# Patient Record
Sex: Male | Born: 2000 | Race: Black or African American | Hispanic: No | Marital: Single | Smoking: Current every day smoker
Health system: Southern US, Community
[De-identification: ages and names within clinical notes are randomized; demographics above are authoritative.]

---
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
## 2020-02-03 ENCOUNTER — Emergency Department: Payer: Medicaid Other

## 2020-02-03 ENCOUNTER — Other Ambulatory Visit: Payer: Self-pay

## 2020-02-03 ENCOUNTER — Emergency Department
Admission: EM | Admit: 2020-02-03 | Discharge: 2020-02-04 | Disposition: A | Discharge status: Home / Self Care | Payer: Medicaid Other | Attending: Emergency Medicine | Admitting: Emergency Medicine

## 2020-02-03 DIAGNOSIS — F1310 Sedative, hypnotic or anxiolytic abuse, uncomplicated: Secondary | ICD-10-CM

## 2020-02-03 DIAGNOSIS — F17210 Nicotine dependence, cigarettes, uncomplicated: Secondary | ICD-10-CM | POA: Diagnosis not present

## 2020-02-03 DIAGNOSIS — F1910 Other psychoactive substance abuse, uncomplicated: Secondary | ICD-10-CM | POA: Diagnosis present

## 2020-02-03 DIAGNOSIS — T50901A Poisoning by unspecified drugs, medicaments and biological substances, accidental (unintentional), initial encounter: Secondary | ICD-10-CM | POA: Diagnosis present

## 2020-02-03 LAB — COMPREHENSIVE METABOLIC PANEL
ALT: 10 U/L (ref 0–44)
AST: 17 U/L (ref 15–41)
Albumin: 4.4 g/dL (ref 3.5–5.0)
Alkaline Phosphatase: 85 U/L (ref 38–126)
Anion gap: 9 (ref 5–15)
BUN: 16 mg/dL (ref 6–20)
CO2: 27 mmol/L (ref 22–32)
Calcium: 9.1 mg/dL (ref 8.9–10.3)
Chloride: 103 mmol/L (ref 98–111)
Creatinine, Ser: 1.07 mg/dL (ref 0.61–1.24)
GFR calc Af Amer: >60 mL/min (ref >60)
GFR calc non Af Amer: >60 mL/min (ref >60)
Glucose, Bld: 91 mg/dL (ref 70–99)
Potassium: 3.8 mmol/L (ref 3.5–5.1)
Sodium: 139 mmol/L (ref 135–145)
Total Bilirubin: 0.8 mg/dL (ref 0.3–1.2)
Total Protein: 7.5 g/dL (ref 6.5–8.1)

## 2020-02-03 LAB — CBC
HCT: 43.1 % (ref 39.0–52.0)
Hemoglobin: 14.7 g/dL (ref 13.0–17.0)
MCH: 29.8 pg (ref 26.0–34.0)
MCHC: 34.1 g/dL (ref 30.0–36.0)
MCV: 87.4 fL (ref 80.0–100.0)
Platelets: 323 10*3/uL (ref 150–400)
RBC: 4.93 MIL/uL (ref 4.22–5.81)
RDW: 12.8 % (ref 11.5–15.5)
WBC: 7.5 10*3/uL (ref 4.0–10.5)
nRBC: 0 % (ref 0.0–0.2)

## 2020-02-03 LAB — ETHANOL: Alcohol, Ethyl (B): <10 mg/dL (ref <10)

## 2020-02-03 LAB — SALICYLATE LEVEL: Salicylate Lvl: <7 mg/dL — ABNORMAL LOW (ref 7.0–30.0)

## 2020-02-03 LAB — ACETAMINOPHEN LEVEL: Acetaminophen (Tylenol), Serum: <10 ug/mL — ABNORMAL LOW (ref 10–30)

## 2020-02-03 NOTE — ED Notes (Signed)
This RN given ok by Dr. Darnelle Catalan and Charge RN for pt's father to come back

## 2020-02-03 NOTE — ED Notes (Signed)
This RN updated pt's dad at bedside and pt's mother via phone.

## 2020-02-03 NOTE — ED Provider Notes (Addendum)
[facility] Emergency Department Provider Note   ____________________________________________   First MD Initiated Contact with Patient 02/03/20 1841     (approximate)  I have reviewed the triage vital signs and the nursing notes.   HISTORY  Chief Complaint Drug Overdose  History limited by altered mental status  HPI Francisco Miranda is a 19 y.o. male comes from home by EMS.  He had apparently been out for with a friend's parents noted he was acting funny.  EMS said he took an unknown amount of Xanax.  Patient also fell on the entertainment center.  He denied pain to EMS.  His right foot is turned outward.  Here patient is very somnolent I am able to wake him up a little bit and he denies any pain.  I do not feel any swelling on palpating his leg.         History reviewed. No pertinent past medical history.  There are no problems to display for this patient.   History reviewed. No pertinent surgical history.  Prior to Admission medications   Not on File    Allergies Patient has no known allergies.  History reviewed. No pertinent family history.  Social History Social History   Tobacco Use  . Smoking status: Current Every Day Smoker  . Smokeless tobacco: Never Used  Substance Use Topics  . Alcohol use: Yes  . Drug use: Not on file    Review of Systems  Unable to obtain ____________________________________________   PHYSICAL EXAM:  VITAL SIGNS: ED Triage Vitals  Enc Vitals Group     BP 02/03/20 1838 118/66     Pulse Rate 02/03/20 1836 68     Resp 02/03/20 1836 18     Temp 02/03/20 1838 (!) 97.5 F (36.4 C)     Temp Source 02/03/20 1838 Oral     SpO2 02/03/20 1836 100 %     Weight 02/03/20 1835 180 lb 12.4 oz (82 kg)     Height 02/03/20 1835 5\' 6"  (1.676 m)     Head Circumference --      Peak Flow --      Pain Score 02/03/20 1834 0     Pain Loc --      Pain Edu? --      Excl. in GC? --     Constitutional: Patient  is very sleepy and difficult to arouse Eyes: Conjunctivae are normal.  Pupils are dilated gaze is disconjugate Head: Atraumatic. Nose: No congestion/rhinnorhea. Mouth/Throat: Mucous membranes are moist.  Oropharynx non-erythematous. Neck: No stridor.  Cardiovascular: Normal rate, regular rhythm. Grossly normal heart sounds.  Good peripheral circulation. Respiratory: Normal respiratory effort.  No retractions. Lungs CTAB. Gastrointestinal: Soft and nontender. No distention. No abdominal bruits.. Musculoskeletal: No lower extremity tenderness nor edema.  See HPI Neurologic: Patient very hard to arouse when he wakes up he mumbles 1 or 2 words and will answer some questions yes or no.  Goes back to sleep rapidly. Skin:  Skin is warm, dry and intact. No rash noted.   ____________________________________________   LABS (all labs ordered are listed, but only abnormal results are displayed)  Labs Reviewed  ACETAMINOPHEN LEVEL - Abnormal; Notable for the following components:      Result Value   Acetaminophen (Tylenol), Serum <10 (*)    All other components within normal limits  SALICYLATE LEVEL - Abnormal; Notable for the following components:   Salicylate Lvl <7.0 (*)    All other components within normal  limits  COMPREHENSIVE METABOLIC PANEL  ETHANOL  CBC  URINE DRUG SCREEN, QUALITATIVE ([facility] ONLY)   ____________________________________________  EKG  EKG read interpreted by me shows normal sinus rhythm rate of 69 normal axis no acute ST-T wave changes ____________________________________________  RADIOLOGY  ED MD interpretation: CT head and neck read by radiology reviewed by me are normal  Official radiology report(s): CT Head Wo Contrast  Result Date: 02/03/2020 CLINICAL DATA:  Ingested unknown amount of the Xanax, bradycardia, altered level of consciousness, head trauma EXAM: CT HEAD WITHOUT CONTRAST CT CERVICAL SPINE WITHOUT CONTRAST TECHNIQUE: Multidetector CT imaging of  the head and cervical spine was performed following the standard protocol without intravenous contrast. Multiplanar CT image reconstructions of the cervical spine were also generated. COMPARISON:  None. FINDINGS: CT HEAD FINDINGS Brain: No acute infarct or hemorrhage. Lateral ventricles and midline structures are unremarkable. No acute extra-axial fluid collections. No mass effect. Vascular: No hyperdense vessel or unexpected calcification. Skull: Chronic punctate metallic foreign body within the anterior scalp. Negative for fracture or focal lesion. Sinuses/Orbits: No acute finding. Other: Reconstructed images demonstrate no additional findings. CT CERVICAL SPINE FINDINGS Alignment: Slight straightening of the cervical spine may be positional. Otherwise alignment is anatomic. Skull base and vertebrae: No acute displaced fracture. Soft tissues and spinal canal: No prevertebral fluid or swelling. No visible canal hematoma. Disc levels:  No significant spondylosis or facet hypertrophy. Upper chest: Airway is patent.  Lung apices are clear. Other: Reconstructed images demonstrate no additional findings. IMPRESSION: 1. No acute intracranial process. 2. Unremarkable cervical spine. Electronically Signed   By: Sharlet Salina M.D.   On: 02/03/2020 19:25   CT Cervical Spine Wo Contrast  Result Date: 02/03/2020 CLINICAL DATA:  Ingested unknown amount of the Xanax, bradycardia, altered level of consciousness, head trauma EXAM: CT HEAD WITHOUT CONTRAST CT CERVICAL SPINE WITHOUT CONTRAST TECHNIQUE: Multidetector CT imaging of the head and cervical spine was performed following the standard protocol without intravenous contrast. Multiplanar CT image reconstructions of the cervical spine were also generated. COMPARISON:  None. FINDINGS: CT HEAD FINDINGS Brain: No acute infarct or hemorrhage. Lateral ventricles and midline structures are unremarkable. No acute extra-axial fluid collections. No mass effect. Vascular: No  hyperdense vessel or unexpected calcification. Skull: Chronic punctate metallic foreign body within the anterior scalp. Negative for fracture or focal lesion. Sinuses/Orbits: No acute finding. Other: Reconstructed images demonstrate no additional findings. CT CERVICAL SPINE FINDINGS Alignment: Slight straightening of the cervical spine may be positional. Otherwise alignment is anatomic. Skull base and vertebrae: No acute displaced fracture. Soft tissues and spinal canal: No prevertebral fluid or swelling. No visible canal hematoma. Disc levels:  No significant spondylosis or facet hypertrophy. Upper chest: Airway is patent.  Lung apices are clear. Other: Reconstructed images demonstrate no additional findings. IMPRESSION: 1. No acute intracranial process. 2. Unremarkable cervical spine. Electronically Signed   By: Sharlet Salina M.D.   On: 02/03/2020 19:25   DG Chest Portable 1 View  Result Date: 02/03/2020 CLINICAL DATA:  Altered mental status EXAM: PORTABLE CHEST 1 VIEW COMPARISON:  2019 FINDINGS: Low lung volumes. No consolidation or edema. No pleural effusion. Cardiomediastinal contours are within normal limits with normal heart size. IMPRESSION: Low lung volumes.  Otherwise unremarkable. Electronically Signed   By: Guadlupe Spanish M.D.   On: 02/03/2020 19:10    ____________________________________________   PROCEDURES  Procedure(s) performed (including Critical Care):  Procedures   ____________________________________________   INITIAL IMPRESSION / ASSESSMENT AND PLAN / ED COURSE  Patient unable  to give me a good history.  He does seem to be maintaining his airway currently and breathing regularly.  We will get a CT of his head to make sure he is head is okay since he did fall and get a chest x-ray CT of his neck.    ----------------------------------------- 11:50 PM on 02/03/2020 -----------------------------------------  Patient doing well so far signed out to oncoming provider I did  speak with his dad who reports he has no medical problems         ____________________________________________   FINAL CLINICAL IMPRESSION(S) / ED DIAGNOSES  Final diagnoses:  Drug overdose, undetermined intent, initial encounter     ED Discharge Orders    None       Note:  This document was prepared using Dragon voice recognition software and may include unintentional dictation errors.    Arnaldo Natal, MD 02/03/20 2224    Arnaldo Natal, MD 02/03/20 2350

## 2020-02-03 NOTE — ED Notes (Signed)
Pt transported to CT with CT tech and security  °
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
## 2020-02-03 NOTE — ED Notes (Signed)
Psychiatrist at bedside

## 2020-02-04 DIAGNOSIS — F1910 Other psychoactive substance abuse, uncomplicated: Secondary | ICD-10-CM | POA: Diagnosis present

## 2020-02-04 DIAGNOSIS — F1310 Sedative, hypnotic or anxiolytic abuse, uncomplicated: Secondary | ICD-10-CM | POA: Diagnosis present

## 2020-02-04 LAB — URINE DRUG SCREEN, QUALITATIVE (ARMC ONLY)
Amphetamines, Ur Screen: NOT DETECTED
Barbiturates, Ur Screen: NOT DETECTED
Benzodiazepine, Ur Scrn: POSITIVE — AB
Cannabinoid 50 Ng, Ur ~~LOC~~: POSITIVE — AB
Cocaine Metabolite,Ur ~~LOC~~: NOT DETECTED
MDMA (Ecstasy)Ur Screen: NOT DETECTED
Methadone Scn, Ur: NOT DETECTED
Opiate, Ur Screen: NOT DETECTED
Phencyclidine (PCP) Ur S: NOT DETECTED
Tricyclic, Ur Screen: NOT DETECTED

## 2020-02-04 NOTE — Discharge Instructions (Addendum)
Accidental Drug Poisoning, Adult Accidental drug poisoning happens when a person accidentally takes too much of a substance, such as a prescription medicine, an over-the-counter medicine, a vitamin, a supplement, or an illegal drug. The effects of drug poisoning can be mild, dangerous, or even deadly. What are the causes? This condition is caused by taking too much of a medicine, illegal drug, or other substance. It often results from:  Lack of knowledge about a substance.  Using more than one substance at the same time.  An error made by the health care provider who prescribed the substance.  An error made by the pharmacist who filled the prescription.  A lapse in memory, such as forgetting that you have already taken a dose of the medicine.  Suddenly using a substance after a long period of not using it. The following substances and medicines are more likely to cause an accidental drug poisoning:  Medicines that treat mental problems (psychotropic medicines).  Pain medicines.  Cocaine.  Heroin.  Multivitamins that contain iron.  Over-the-counter cold and cough medicines. What increases the risk? This condition is more likely to occur in:  Elderly adults. Elderly adults are at risk because they may: ? Be taking many different medicines. ? Have difficulty reading labels. ? Forget when they last took their medicine.  People who use illegal drugs.  People who drink alcohol while using illegal drugs or certain medicines.  People with certain mental health conditions. What are the signs or symptoms? Symptoms of this condition depend on the substance and the amount that was taken. Common symptoms include:  Behavior changes, such as confusion.  Sleepiness.  Weakness.  Slowed breathing.  Nausea and vomiting.  Seizures.  Very large or small eye pupil size. A drug poisoning can cause a very serious condition in which your blood pressure drops to a low level (shock).  Symptoms of shock include:  Cold and clammy skin.  Pale skin.  Blue lips.  Very slow breathing.  Extreme sleepiness.  Severe confusion.  Dizziness or fainting. How is this diagnosed? This condition is diagnosed based on:  Your symptoms. You will be asked about the substances you took and when you took them.  A physical exam. You may also have other tests, including:  Urine tests.  Blood tests.  An electrocardiogram (ECG). How is this treated? This condition may need to be treated right away at the hospital. Treatment may involve:  Getting fluids and electrolytes through an IV.  Having a breathing tube inserted in your airway (endotracheal tube) to help you breathe.  Taking medicines. These may include medicines that: ? Absorb any substance that is in your digestive system. ? Block or reverse the effect of the substance that caused the drug poisoning.  Having your blood filtered through an artificial kidney machine (hemodialysis).  Ongoing counseling and mental health support. This may be provided if you used an illegal drug. Follow these instructions at home: Medicines   Take over-the-counter and prescription medicines only as told by your health care provider.  Before taking a new medicine, ask your health care provider whether the medicine: ? May cause side effects. ? Might react with other medicines.  Keep a list of all the medicines that you take, including over-the-counter medicines, vitamins, supplements, and herbs. Bring this list with you to all of your medical visits. General instructions   Drink enough fluid to keep your urine pale yellow.  If you are working with a counselor or mental health professional, make   sure to follow his or her instructions.  Do not drink alcohol if: ? Your health care provider tells you not to drink. ? You are pregnant, may be pregnant, or are planning to become pregnant.  If you drink alcohol, limit how much you  have: ? 0-1 drink a day for women. ? 0-2 drinks a day for men.  Be aware of how much alcohol is in your drink. In the [place] one drink equals one typical bottle of beer (12 oz), one-half glass of wine (5 oz), or one shot of hard liquor (1 oz).  Keep all follow-up visits as told by your health care provider. This is important. How is this prevented?   Get help if you are struggling with: ? Alcohol or drug use. ? Depression or another mental health problem.  Keep the phone number of your local poison control center near your phone or on your cell phone. The hotline of the American Association of Poison Control Centers is (800) 222-1222.  Store all medicines in safety containers that are out of the reach of children.  Read the drug inserts that come with your medicines.  Create a system for taking your medicine, such as a pillbox, that will help you avoid taking too much of the medicine.  Do not drink alcohol while taking medicines unless your health care provider approves.  Do not use illegal drugs.  Do not take medicines that are not prescribed for you. Contact a health care provider if:  Your symptoms return.  You develop new symptoms or side effects after taking a medicine.  You have questions about possible drug poisoning. Call your local poison control center at (800) 222-1222. Get help right away if:  You think that you or someone else may have taken too much of a substance.  You or someone else is having symptoms of drug poisoning. Summary  Accidental drug poisoning happens when a person accidentally takes too much of a substance, such as a prescription medicine, an over-the-counter medicine, a vitamin, a supplement, or an illegal drug.  The effects of drug poisoning can be mild, dangerous, or even deadly.  This condition is diagnosed based on your symptoms and a physical exam. You will be asked to tell your health care provider which substances you took and when you  took them.  This condition may need to be treated right away at the hospital. This information is not intended to replace advice given to you by your health care provider. Make sure you discuss any questions you have with your health care provider. Document Revised: 05/03/2017 Document Reviewed: 04/22/2017 Elsevier Patient Education  2020 [employer]  

## 2020-02-04 NOTE — ED Provider Notes (Signed)
-----------------------------------------   9:10 AM on 02/04/2020 -----------------------------------------  Patient continues to sleep.  I reassessed the patient.  Patient is on a cardiac monitor.  Will briefly start her when you attempt to wake him but then quickly falls back asleep.  We will continue to allow the patient to rest in the emergency department and reassess once more awake.  ----------------------------------------- 11:55 AM on 02/04/2020 -----------------------------------------  Patient is now awake and alert.  Psychiatry is spoken to the patient, believe the patient is safe for discharge home from psychiatric standpoint.  Patient accidentally took the Xanax thinking it was a pain pill.  No SI or HI.  Patient will be discharged home.   Minna Antis, MD 02/04/20 1156
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
## 2020-02-04 NOTE — Consult Note (Signed)
[facility] ED Discharge  02/04/2020 11:18 AM Francisco Miranda  MRN:  [id number] Principal Problem: Benzodiazepine abuse [facility]) Discharge Diagnoses: Principal Problem:   Benzodiazepine abuse (HCC)  Subjective: "I told my friends I needed something for my ankle pains (juvenile arthritis).  They gave me a pill they said for pain."  Patient seen and evaluated in person by this provider and [facility] Specialist.  He reports he took medicine from friends thinking it was pain medicine.  He started stumbling at home and acting strange, sent to the ED.  Evidently, the pain medicine was 20 mg of Xanax.  Denies suicidal/homicidal ideations, hallucinations, withdrawal symptoms, and other psychiatric concerns.  Permission obtained to speak with his mother who had no safety concerns and agreeable to come and get him.  Educated patient on taking unknown medications and the dangers.  Pleasant and cooperative, psychiatrically stable for discharge.  Total Time spent with patient: 45 minutes  Past Psychiatric History: none  Past Medical History: History reviewed. No pertinent past medical history. History reviewed. No pertinent surgical history. Family History: History reviewed. No pertinent family history. Family Psychiatric  History: none Social History:  Social History   Substance and Sexual Activity  Alcohol Use Yes     Social History   Substance and Sexual Activity  Drug Use Not on file    Social History   Socioeconomic History  . Marital status: Single    Spouse name: Not on file  . Number of children: Not on file  . Years of education: Not on file  . Highest education level: Not on file  Occupational History  . Not on file  Tobacco Use  . Smoking status: Current Every Day Smoker  . Smokeless tobacco: Never Used  Substance and Sexual Activity  . Alcohol use: Yes  . Drug use: Not on file  . Sexual activity: Not on file  Other Topics Concern  . Not on file  Social History Narrative  . Not on  file   Social Determinants of Health   Financial Resource Strain:   . Difficulty of Paying Living Expenses: Not on file  Food Insecurity:   . Worried About Programme researcher, broadcasting/film/video in the Last Year: Not on file  . Ran Out of Food in the Last Year: Not on file  Transportation Needs:   . Lack of Transportation (Medical): Not on file  . Lack of Transportation (Non-Medical): Not on file  Physical Activity:   . Days of Exercise per Week: Not on file  . Minutes of Exercise per Session: Not on file  Stress:   . Feeling of Stress : Not on file  Social Connections:   . Frequency of Communication with Friends and Family: Not on file  . Frequency of Social Gatherings with Friends and Family: Not on file  . Attends Religious Services: Not on file  . Active Member of Clubs or Organizations: Not on file  . Attends Banker Meetings: Not on file  . Marital Status: Not on file    Has this patient used any form of tobacco in the last 30 days? (Cigarettes, Smokeless Tobacco, Cigars, and/or Pipes) A prescription for an FDA-approved tobacco cessation medication was offered at discharge and the patient refused  Current Medications: No current facility-administered medications for this encounter.   No current outpatient medications on file.   PTA Medications: (Not in a hospital admission)   Musculoskeletal: Strength & Muscle Tone: within normal limits Gait & Station: normal Patient leans:  N/A  Psychiatric Specialty Exam: Physical Exam Vitals and nursing note reviewed.  Constitutional:      Appearance: Normal appearance.  Pulmonary:     Effort: Pulmonary effort is normal.  Musculoskeletal:     Cervical back: Normal range of motion.  Neurological:     General: No focal deficit present.     Mental Status: He is alert and oriented to person, place, and time.  Psychiatric:        Attention and Perception: Attention and perception normal.        Mood and Affect: Mood and affect  normal.        Speech: Speech normal.        Behavior: Behavior normal. Behavior is cooperative.        Thought Content: Thought content normal.        Cognition and Memory: Cognition and memory normal.        Judgment: Judgment is impulsive.     Review of Systems  Psychiatric/Behavioral: Positive for decreased concentration.  All other systems reviewed and are negative.   Blood pressure (!) 109/48, pulse 64, temperature 98.1 F (36.7 C), temperature source Oral, resp. rate 16, height 5\' 6"  (1.676 m), weight 82 kg, SpO2 100 %.Body mass index is 29.18 kg/m.  General Appearance: Casual  Eye Contact:  Good  Speech:  Clear and Coherent  Volume:  Normal  Mood:  Euphoric  Affect:  Congruent  Thought Process:  Coherent  Orientation:  Full (Time, Place, and Person)  Thought Content:  WDL and Logical  Suicidal Thoughts:  No  Homicidal Thoughts:  No  Memory:  Immediate;   Good Recent;   Good Remote;   Good  Judgement:  Fair  Insight:  Good  Psychomotor Activity:  Normal  Concentration:  Concentration: Good and Attention Span: Good  Recall:  Good  Fund of Knowledge:  Good  Language:  Good  Akathisia:  No  Handed:  Right  AIMS (if indicated):     Assets:  Housing Leisure Time Physical Health Resilience Social Support  ADL's:  Intact  Cognition:  WNL  Sleep:        Demographic Factors:  Male and Adolescent or young adult  Loss Factors: NA  Historical Factors: Impulsivity  Risk Reduction Factors:   Sense of responsibility to family and Living with another person, especially a relative  Continued Clinical Symptoms:  None  Cognitive Features That Contribute To Risk:  None    Suicide Risk:  Minimal: No identifiable suicidal ideation.  Patients presenting with no risk factors but with morbid ruminations; may be classified as minimal risk based on the severity of the depressive symptoms   Plan Of Care/Follow-up recommendations:  Benzodiazepine overdose,  unintentional: -Refrain from alcohol and drug use Activity:  as tolerated Diet:  heart healthy diet  Disposition: discharge home , NP 02/04/2020, 11:18 AM

## 2020-02-04 NOTE — ED Notes (Signed)
Pt urine specimen obtained and sent to lab, upon pt getting up from the bed, found pt belonging in a bag in the bed with the pt, belonging taken and secured on the unit.

## 2020-02-04 NOTE — ED Notes (Signed)
Papers rescinded, pending D/C 

## 2020-02-04 NOTE — ED Notes (Signed)
Father at the bedside.

## 2020-02-04 NOTE — Consult Note (Signed)
[facility] Consult   Reason for Consult:  Drug Overdose Referring Physician:  Darnelle CatalanMalinda Patient Identification: Francisco Miranda MRN:  [id number] Principal Diagnosis: <principal problem not specified> Diagnosis:  Active Problems:   Substance abuse (HCC)   Total Time spent with patient: 45 minutes  Subjective:   Read DriversMarkee L Miranda is a 19 y.o. male patient [facility] initially he was voluntary and was placed under involuntary commitment by the EDP. Per the ED triage nursing note, the patient had been out with a friend when his parents noticed he was acting differently. Per EMS, the patient was stating he possibly took an unknown amount of Xanax. VSS except EMS declaring the patient RR will drop. Per EMS, the patient fell on an entertainment center as well as PTA. The patient right foot turned outward. The patient denies pain.  Upon arrival, the patient is lethargic. The patient is stating he took 1 Xanax 20g in place. The patient is answering questions at this time--the patient with an ankle monitor in place.  The patient was seen face-to-face by this provider; the chart was reviewed and consulted with Dr.Malinda on 02/03/2020 due to the patient's care. It was discussed with the EDP that the patient does/does not meet the criteria to be admitted to the inpatient unit.  On evaluation, the patient is unresponsive, difficult to arouse, and is very somnolent. The patient is unable to wake him up a little bit, and he denies any pain. The patient does not appear to be responding to internal or external stimuli. Neither is the patient presenting with any delusional thinking.   Collateral was obtained from the patient's dad (Francisco Miranda), who voiced that his son had begun acting strange and his ex-wife called him. Dad disclosed that the patient does not have any mental health problems. She revealed that the patient was charged with Arm Robbery three years ago, and  about a year ago, he was placed on probation and had to wear an ankle bracelet. The patient's dad disclosed that he would have to be on probation for another year. He voiced that his son is not on any prescription drug. He states that he has never seen a therapist or any health care provider not as a child or a young adult HPI: Dr. Darnelle CatalanMalinda, Francisco LangeMarkee L Miranda is a 19 y.o. male comes from home by EMS.  He had apparently been out for with a friend's parents noted he was acting funny.  EMS said he took an unknown amount of Xanax.  Patient also fell on the entertainment center.  He denied pain to EMS.  His right foot is turned outward. Here patient is very somnolent I am able to wake him up a little bit and he denies any pain.  I do not feel any swelling on palpating his leg. Past Psychiatric History: None  Risk to Self:   Unable to assess Risk to Others:   Unable to assess Prior Inpatient Therapy:  None Prior Outpatient Therapy:   None  Past Medical History: History reviewed. No pertinent past medical history. History reviewed. No pertinent surgical history. Family History: History reviewed. No pertinent family history. Family Psychiatric  History: None Social History:  Social History   Substance and Sexual Activity  Alcohol Use Yes     Social History   Substance and Sexual Activity  Drug Use Not on file    Social History   Socioeconomic History  . Marital status: Single  Spouse name: Not on file  . Number of children: Not on file  . Years of education: Not on file  . Highest education level: Not on file  Occupational History  . Not on file  Tobacco Use  . Smoking status: Current Every Day Smoker  . Smokeless tobacco: Never Used  Substance and Sexual Activity  . Alcohol use: Yes  . Drug use: Not on file  . Sexual activity: Not on file  Other Topics Concern  . Not on file  Social History Narrative  . Not on file   Social Determinants of Health   Financial Resource Strain:    . Difficulty of Paying Living Expenses: Not on file  Food Insecurity:   . Worried About Programme researcher, broadcasting/film/video in the Last Year: Not on file  . Ran Out of Food in the Last Year: Not on file  Transportation Needs:   . Lack of Transportation (Medical): Not on file  . Lack of Transportation (Non-Medical): Not on file  Physical Activity:   . Days of Exercise per Week: Not on file  . Minutes of Exercise per Session: Not on file  Stress:   . Feeling of Stress : Not on file  Social Connections:   . Frequency of Communication with Friends and Family: Not on file  . Frequency of Social Gatherings with Friends and Family: Not on file  . Attends Religious Services: Not on file  . Active Member of Clubs or Organizations: Not on file  . Attends Banker Meetings: Not on file  . Marital Status: Not on file   Additional Social History:    Allergies:  No Known Allergies  Labs:  Results for orders placed or performed during the hospital encounter of 02/03/20 (from the past 48 hour(s))  Comprehensive metabolic panel     Status: None   Collection Time: 02/03/20  6:40 PM  Result Value Ref Range   Sodium 139 135 - 145 mmol/L   Potassium 3.8 3.5 - 5.1 mmol/L   Chloride 103 98 - 111 mmol/L   CO2 27 22 - 32 mmol/L   Glucose, Bld 91 70 - 99 mg/dL    Comment: Glucose reference range applies only to samples taken after fasting for at least 8 hours.   BUN 16 6 - 20 mg/dL   Creatinine, Ser 4.17 0.61 - 1.24 mg/dL   Calcium 9.1 8.9 - 40.8 mg/dL   Total Protein 7.5 6.5 - 8.1 g/dL   Albumin 4.4 3.5 - 5.0 g/dL   AST 17 15 - 41 U/L   ALT 10 0 - 44 U/L   Alkaline Phosphatase 85 38 - 126 U/L   Total Bilirubin 0.8 0.3 - 1.2 mg/dL   GFR calc non Af Amer >60 >60 mL/min   GFR calc Af Amer >60 >60 mL/min   Anion gap 9 5 - 15    Comment: Performed at [facility], [address], [place], [place] [address]  Ethanol     Status: None   Collection Time: 02/03/20  6:40 PM  Result Value Ref  Range   Alcohol, Ethyl (B) <10 <10 mg/dL    Comment: (NOTE) Lowest detectable limit for serum alcohol is 10 mg/dL.  For medical purposes only. Performed at [facility], [address] [place], [place] [address]   cbc     Status: None   Collection Time: 02/03/20  6:40 PM  Result Value Ref Range   WBC 7.5 4.0 - 10.5 K/uL  RBC 4.93 4.22 - 5.81 MIL/uL   Hemoglobin 14.7 13.0 - 17.0 g/dL   HCT 78.5 39 - 52 %   MCV 87.4 80.0 - 100.0 fL   MCH 29.8 26.0 - 34.0 pg   MCHC 34.1 30.0 - 36.0 g/dL   RDW 88.5 02.7 - 74.1 %   Platelets 323 150 - 400 K/uL   nRBC 0.0 0.0 - 0.2 %    Comment: Performed at [facility], [address], [place], [place] [address]  Acetaminophen level     Status: Abnormal   Collection Time: 02/03/20  6:40 PM  Result Value Ref Range   Acetaminophen (Tylenol), Serum <10 (L) 10 - 30 ug/mL    Comment: (NOTE) Therapeutic concentrations vary significantly. A range of 10-30 ug/mL  may be an effective concentration for many patients. However, some  are best treated at concentrations outside of this range. Acetaminophen concentrations >150 ug/mL at 4 hours after ingestion  and >50 ug/mL at 12 hours after ingestion are often associated with  toxic reactions.  Performed at [facility], [address] [place], [place] [address]   Salicylate level     Status: Abnormal   Collection Time: 02/03/20  6:40 PM  Result Value Ref Range   Salicylate Lvl <7.0 (L) 7.0 - 30.0 mg/dL    Comment: Performed at [facility], [address] [place], [place] [address]    No current facility-administered medications for this encounter.   No current outpatient medications on file.    Musculoskeletal: Strength & Muscle Tone: Unable to assess Gait & Station: Unable to assess Patient leans: Unable to assess  Psychiatric Specialty Exam: Physical Exam Musculoskeletal:        General: Normal range of motion.  Skin:    General: Skin is warm  and dry.     Review of Systems  Blood pressure 128/61, pulse 76, temperature (!) 97.5 F (36.4 C), temperature source Oral, resp. rate 17, height 5\' 6"  (1.676 m), weight 82 kg, SpO2 100 %.Body mass index is 29.18 kg/m.  General Appearance: NA  Eye Contact:  None  Speech:  Unable to arouse  Volume:  Unable to assess  Mood:  NA  Affect:  NA  Thought Process:  NA  Orientation:  Other:  Unable to assess  Thought Content:  Unable to assess  Suicidal Thoughts:  Unable to assess  Homicidal Thoughts:  Unable to assess  Memory:  Immediate;   Unable to assess  Judgement:  Other:  Unable to assess  Insight:  NA  Psychomotor Activity:  Unable to assess  Concentration:  Concentration: Unable to assess  Recall:  Unable to assess  Fund of Knowledge:  Unable to assess  Language:  Unable to assess  Akathisia:  NA  Handed:  Right  AIMS (if indicated):     Assets:  Others:  Unable to assess  ADL's:  Impaired  Cognition:  Impaired,  Mild  Sleep:    Sleeps well     Treatment Plan Summary: Daily contact with patient to assess and evaluate symptoms and progress in treatment and Plan The patient remained under observation overnight and will be reassessed in the a.m. to determine if he meets the criteria for psychiatric inpatient admission; he could be discharged back home.  Disposition: Supportive therapy provided about ongoing stressors. The patient remained under observation overnight and will be reassessed in the a.m. to determine if he meets the criteria for psychiatric inpatient admission; he could be discharged back  home.  Gillermo Murdoch, NP 02/04/2020 1:30 AM

## 2020-02-04 NOTE — ED Notes (Signed)
Pt given discharge instructions and phone to call parents. Father to come get pt. Pt up in room, all belongings returned. Pt given non slip socks as pt did not come in with shoes. Pt to be discharged to lobby to wait for father. Gait steady, pt denies dizziness

## 2020-02-10 ENCOUNTER — Encounter: Payer: Self-pay | Admitting: Emergency Medicine

## 2020-02-10 ENCOUNTER — Other Ambulatory Visit: Payer: Self-pay

## 2020-02-10 ENCOUNTER — Emergency Department
Admission: EM | Admit: 2020-02-10 | Discharge: 2020-02-10 | Disposition: A | Discharge status: Home / Self Care | Payer: Medicaid Other | Attending: Emergency Medicine | Admitting: Emergency Medicine

## 2020-02-10 DIAGNOSIS — F17200 Nicotine dependence, unspecified, uncomplicated: Secondary | ICD-10-CM | POA: Diagnosis not present

## 2020-02-10 DIAGNOSIS — J020 Streptococcal pharyngitis: Secondary | ICD-10-CM | POA: Insufficient documentation

## 2020-02-10 MED ORDER — AMOXICILLIN 875 MG PO TABS: 875.0000 mg | ORAL_TABLET | Freq: Two times a day (BID) | ORAL | 0 refills | Status: DC

## 2020-02-10 MED ORDER — AMOXICILLIN 500 MG PO CAPS
1000.0000 mg | ORAL_CAPSULE | Freq: Once | ORAL | Status: AC
  Administered 2020-02-10: 1000 mg via ORAL
  Filled 2020-02-10: qty 2

## 2020-02-10 MED ORDER — MAGIC MOUTHWASH W/LIDOCAINE: 5.0000 mL | Freq: Four times a day (QID) | ORAL | 0 refills | Status: DC

## 2020-02-10 MED ORDER — LIDOCAINE VISCOUS HCL 2 % MT SOLN
15.0000 mL | Freq: Once | OROMUCOSAL | Status: AC
  Administered 2020-02-10: 15 mL via OROMUCOSAL
  Filled 2020-02-10: qty 15

## 2020-02-10 NOTE — ED Provider Notes (Signed)
[facility] Emergency Department Provider Note  ____________________________________________  Time seen: Approximately 4:40 PM  I have reviewed the triage vital signs and the nursing notes.   HISTORY  Chief Complaint Sore Throat    HPI Francisco Miranda is a 19 y.o. male who presents the emergency department complaining of sore throat.  Patient states that he has a long history of recurrent strep.  Patient believes that he has strep throat again.  Bilateral sore throat.  No fevers, chills, nasal congestion, cough.  No recent sick contacts.  Patient states pain with swallowing but no difficulty breathing or swallowing.  No abdominal complaints.  No over-the-counter medications for this complaint prior to arrival.  Patient states that he has strep 3 to 4 months ago with similar symptoms.         History reviewed. No pertinent past medical history.  Patient Active Problem List   Diagnosis Date Noted  . 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 No pertinent surgical history.  Prior to Admission medications   Medication Sig Start Date End Date Taking? Authorizing Provider  amoxicillin (AMOXIL) 875 MG tablet Take 1 tablet (875 mg total) by mouth 2 (two) times daily. 02/10/20   Rhiley Tarver, Delorise Royals, PA-C  magic mouthwash w/lidocaine SOLN Take 5 mLs by mouth 4 (four) times daily. Gargle and spit 02/10/20   Aly Hauser, Delorise Royals, PA-C    Allergies Patient has no known allergies.  No family history on file.  Social History Social History   Tobacco Use  . Smoking status: Current Every Day Smoker  . Smokeless tobacco: Never Used  Substance Use Topics  . Alcohol use: Yes  . Drug use: Not on file     Review of Systems  Constitutional: No fever/chills Eyes: No visual changes. No discharge ENT: Sore throat Cardiovascular: no chest pain. Respiratory: no cough. No SOB. Gastrointestinal: No abdominal pain.  No nausea, no vomiting.  No  diarrhea.  No constipation. Musculoskeletal: Negative for musculoskeletal pain. Skin: Negative for rash, abrasions, lacerations, ecchymosis. Neurological: Negative for headaches, focal weakness or numbness. 10-point ROS otherwise negative.  ____________________________________________   PHYSICAL EXAM:  VITAL SIGNS: ED Triage Vitals  Enc Vitals Group     BP 02/10/20 1439 116/76     Pulse Rate 02/10/20 1439 79     Resp 02/10/20 1439 16     Temp 02/10/20 1439 99.2 F (37.3 C)     Temp Source 02/10/20 1439 Oral     SpO2 02/10/20 1439 98 %     Weight 02/10/20 1438 180 lb 12.4 oz (82 kg)     Height 02/10/20 1438 5\' 6"  (1.676 m)     Head Circumference --      Peak Flow --      Pain Score 02/10/20 1438 9     Pain Loc --      Pain Edu? --      Excl. in GC? --      Constitutional: Alert and oriented. Well appearing and in no acute distress. Eyes: Conjunctivae are normal. PERRL. EOMI. Head: Atraumatic. ENT:      Ears:       Nose: No congestion/rhinnorhea.      Mouth/Throat: Mucous membranes are moist.  Visualization of the tonsils reveal tonsillar hypertrophy bilaterally.  Uvula is midline.  Exudates bilaterally. Neck: No stridor.  No erythema, edema of the neck.  No tenderness along the anterior neck. Hematological/Lymphatic/Immunilogical: Bilateral tender anterior cervical lymphadenopathy. Cardiovascular: Normal rate, regular rhythm.  Normal S1 and S2.  Good peripheral circulation. Respiratory: Normal respiratory effort without tachypnea or retractions. Lungs CTAB. Good air entry to the bases with no decreased or absent breath sounds. Musculoskeletal: Full range of motion to all extremities. No gross deformities appreciated. Neurologic:  Normal speech and language. No gross focal neurologic deficits are appreciated.  Skin:  Skin is warm, dry and intact. No rash noted. Psychiatric: Mood and affect are normal. Speech and behavior are normal. Patient exhibits appropriate insight and  judgement.   ____________________________________________   LABS (all labs ordered are listed, but only abnormal results are displayed)  Labs Reviewed - No data to display ____________________________________________  EKG   ____________________________________________  RADIOLOGY   No results found.  ____________________________________________    PROCEDURES  Procedure(s) performed:    Procedures    Medications  lidocaine (XYLOCAINE) 2 % viscous mouth solution 15 mL (has no administration in time range)  amoxicillin (AMOXIL) capsule 1,000 mg (has no administration in time range)     ____________________________________________   INITIAL IMPRESSION / ASSESSMENT AND PLAN / ED COURSE  Pertinent labs & imaging results that were available during my care of the patient were reviewed by me and considered in my medical decision making (see chart for details).  Review of the [place] CSRS was performed in accordance of the [facility] prior to dispensing any controlled drugs.           Patient's diagnosis is consistent with strep pharyngitis.  Patient presented to emergency department complaining of sore throat.  Patient has a long standing history of repetitive strep infections.  Findings are consistent with same. Equal tonsillar hypertrophy.  No uvular deviation.  I offered strep testing but I am not treat the patient regardless of results.   Patient would like to forego strep testing which I feel is reasonable. No indication for further labs or imaging at this time.  Patient be started on antibiotics, Magic mouthwash for symptom relief.  Return precautions discussed with the patient.. Patient will be discharged home with prescriptions for amoxicillin and Magic mouthwash. Patient is to follow up with primary care as needed or otherwise directed. Patient is given ED precautions to return to the ED for any worsening or new  symptoms.     ____________________________________________  FINAL CLINICAL IMPRESSION(S) / ED DIAGNOSES  Final diagnoses:  Strep throat      NEW MEDICATIONS STARTED DURING THIS VISIT:  ED Discharge Orders         Ordered    amoxicillin (AMOXIL) 875 MG tablet  2 times daily        02/10/20 1701    magic mouthwash w/lidocaine SOLN  4 times daily       Note to Pharmacy: Dispense in a 1/1/1 ratio. Use lidocaine, diphenhydramine, prednisolone   02/10/20 1701              This chart was dictated using voice recognition software/Dragon. Despite best efforts to proofread, errors can occur which can change the meaning. Any change was purely unintentional.    Racheal Patches, PA-C 02/10/20 1701    Arnaldo Natal, MD 02/10/20 [phone]

## 2020-02-10 NOTE — ED Triage Notes (Signed)
Sore throat x 1 day.  Also having chills.  AAOx3.  Skin warm and dry. NAD

## 2020-10-04 ENCOUNTER — Other Ambulatory Visit: Payer: Self-pay

## 2020-10-04 ENCOUNTER — Emergency Department
Admission: EM | Admit: 2020-10-04 | Discharge: 2020-10-04 | Disposition: A | Discharge status: Home / Self Care | Payer: Medicaid Other | Attending: Emergency Medicine | Admitting: Emergency Medicine

## 2020-10-04 DIAGNOSIS — R197 Diarrhea, unspecified: Secondary | ICD-10-CM | POA: Insufficient documentation

## 2020-10-04 DIAGNOSIS — R112 Nausea with vomiting, unspecified: Secondary | ICD-10-CM | POA: Diagnosis not present

## 2020-10-04 DIAGNOSIS — F17200 Nicotine dependence, unspecified, uncomplicated: Secondary | ICD-10-CM | POA: Diagnosis not present

## 2020-10-04 LAB — URINE DRUG SCREEN, QUALITATIVE (ARMC ONLY)
Amphetamines, Ur Screen: NOT DETECTED
Barbiturates, Ur Screen: NOT DETECTED
Benzodiazepine, Ur Scrn: NOT DETECTED
Cannabinoid 50 Ng, Ur ~~LOC~~: POSITIVE — AB
Cocaine Metabolite,Ur ~~LOC~~: NOT DETECTED
MDMA (Ecstasy)Ur Screen: NOT DETECTED
Methadone Scn, Ur: NOT DETECTED
Opiate, Ur Screen: NOT DETECTED
Phencyclidine (PCP) Ur S: NOT DETECTED
Tricyclic, Ur Screen: NOT DETECTED

## 2020-10-04 LAB — COMPREHENSIVE METABOLIC PANEL
ALT: 14 U/L (ref 0–44)
AST: 26 U/L (ref 15–41)
Albumin: 4.6 g/dL (ref 3.5–5.0)
Alkaline Phosphatase: 79 U/L (ref 38–126)
Anion gap: 10 (ref 5–15)
BUN: 18 mg/dL (ref 6–20)
CO2: 24 mmol/L (ref 22–32)
Calcium: 9.5 mg/dL (ref 8.9–10.3)
Chloride: 104 mmol/L (ref 98–111)
Creatinine, Ser: 1.01 mg/dL (ref 0.61–1.24)
GFR, Estimated: >60 mL/min (ref >60)
Glucose, Bld: 106 mg/dL — ABNORMAL HIGH (ref 70–99)
Potassium: 3.7 mmol/L (ref 3.5–5.1)
Sodium: 138 mmol/L (ref 135–145)
Total Bilirubin: 1.1 mg/dL (ref 0.3–1.2)
Total Protein: 8.2 g/dL — ABNORMAL HIGH (ref 6.5–8.1)

## 2020-10-04 LAB — CBC WITH DIFFERENTIAL/PLATELET
Abs Immature Granulocytes: 0.07 10*3/uL (ref 0.00–0.07)
Basophils Absolute: 0 10*3/uL (ref 0.0–0.1)
Basophils Relative: 0 %
Eosinophils Absolute: 0 10*3/uL (ref 0.0–0.5)
Eosinophils Relative: 0 %
HCT: 43.7 % (ref 39.0–52.0)
Hemoglobin: 15.1 g/dL (ref 13.0–17.0)
Immature Granulocytes: 1 %
Lymphocytes Relative: 3 %
Lymphs Abs: 0.4 10*3/uL — ABNORMAL LOW (ref 0.7–4.0)
MCH: 29.4 pg (ref 26.0–34.0)
MCHC: 34.6 g/dL (ref 30.0–36.0)
MCV: 85.2 fL (ref 80.0–100.0)
Monocytes Absolute: 0.6 10*3/uL (ref 0.1–1.0)
Monocytes Relative: 5 %
Neutro Abs: 12.4 10*3/uL — ABNORMAL HIGH (ref 1.7–7.7)
Neutrophils Relative %: 91 %
Platelets: 332 10*3/uL (ref 150–400)
RBC: 5.13 MIL/uL (ref 4.22–5.81)
RDW: 12 % (ref 11.5–15.5)
WBC: 13.5 10*3/uL — ABNORMAL HIGH (ref 4.0–10.5)
nRBC: 0 % (ref 0.0–0.2)

## 2020-10-04 MED ORDER — ONDANSETRON 4 MG PO TBDP: 4.0000 mg | ORAL_TABLET | Freq: Three times a day (TID) | ORAL | 0 refills | Status: AC | PRN

## 2020-10-04 MED ORDER — SODIUM CHLORIDE 0.9 % IV BOLUS
1000.0000 mL | Freq: Once | INTRAVENOUS | Status: AC
  Administered 2020-10-04: 1000 mL via INTRAVENOUS

## 2020-10-04 MED ORDER — ONDANSETRON 4 MG PO TBDP
4.0000 mg | ORAL_TABLET | Freq: Once | ORAL | Status: AC
  Administered 2020-10-04: 4 mg via ORAL
  Filled 2020-10-04: qty 1
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
## 2020-10-04 NOTE — ED Triage Notes (Signed)
Pt comes with c/o vomiting and some diarrhea since smoking marijuana yesterday. Pt states he is unsure if it is a virus.

## 2020-10-04 NOTE — Discharge Instructions (Signed)
Follow-up with your primary care provider if any continued problems or concerns.  Return to the emergency department if any severe worsening of your symptoms.  A prescription for Zofran was sent to the pharmacy this is the medicine that was given to you while in the ED for nausea and vomiting.  Stay on clear liquids for the next 24 hours.  Once your diarrhea has cleared in 24 hours you can gradually add back foods starting with bananas, rice, applesauce and toast.  Once you are able to tolerate that you can advance your diet leaving milk products last as this may cause gas.

## 2020-10-04 NOTE — ED Provider Notes (Signed)
[facility] Emergency Department Provider Note   ____________________________________________   Event Date/Time   First MD Initiated Contact with Patient 10/04/20 1300     (approximate)  I have reviewed the triage vital signs and the nursing notes.   HISTORY  Chief Complaint Emesis   HPI Francisco Miranda is a 20 y.o. male presents to the ED with complaint of nausea, vomiting and diarrhea that began early this morning.  Patient states that he believes that he smokes marijuana that was "spiked".  Patient states that there are no family members or friends who have any viruses.  Patient reports vomiting approximately 10 times and having diarrhea approximately 10 times since this morning.  He denies any fever, chills, headache or  cough.  Patient rates his pain as 0/10.       History reviewed. No pertinent past medical history.  Patient Active Problem List   Diagnosis Date Noted  . 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 No pertinent surgical history.  Prior to Admission medications   Medication Sig Start Date End Date Taking? Authorizing Provider  ondansetron (ZOFRAN ODT) 4 MG disintegrating tablet Take 1 tablet (4 mg total) by mouth every 8 (eight) hours as needed for nausea or vomiting. 10/04/20  Yes Tommi Rumps, PA-C    Allergies Patient has no known allergies.  No family history on file.  Social History Social History   Tobacco Use  . Smoking status: Current Every Day Smoker  . Smokeless tobacco: Never Used  Substance Use Topics  . Alcohol use: Yes    Comment: occ  . Drug use: Yes    Types: Marijuana    Comment: yesterday    Review of Systems Constitutional: No fever/chills Eyes: No visual changes. ENT: No sore throat. Cardiovascular: Denies chest pain. Respiratory: Denies shortness of breath. Gastrointestinal: No abdominal pain.  Positive nausea, positive vomiting.  Positive diarrhea.  No  constipation. Genitourinary: Negative for dysuria. Musculoskeletal: Negative for back pain. Skin: Negative for rash. Neurological: Negative for headaches, focal weakness or numbness. ____________________________________________   PHYSICAL EXAM:  VITAL SIGNS: ED Triage Vitals  Enc Vitals Group     BP 10/04/20 1201 121/74     Pulse Rate 10/04/20 1201 (!) 103     Resp 10/04/20 1201 18     Temp 10/04/20 1201 98.6 F (37 C)     Temp Source 10/04/20 1201 Oral     SpO2 10/04/20 1201 100 %     Weight 10/04/20 1202 160 lb (72.6 kg)     Height 10/04/20 1202 5\' 11"  (1.803 m)     Head Circumference --      Peak Flow --      Pain Score 10/04/20 1202 0     Pain Loc --      Pain Edu? --      Excl. in GC? --     Constitutional: Alert and oriented. Well appearing and in no acute distress.  Patient is able to talk in complete sentences and has been observed ambulatory while in the ED. Eyes: Conjunctivae are normal. PERRL. EOMI. Head: Atraumatic. Nose: No congestion/rhinnorhea. Mouth/Throat: Mucous membranes are moist.  Oropharynx non-erythematous. Neck: No stridor.   Cardiovascular: Normal rate, regular rhythm. Grossly normal heart sounds.  Good peripheral circulation. Respiratory: Normal respiratory effort.  No retractions. Lungs CTAB. Gastrointestinal: Soft and nontender. No distention.  Bowel sounds are slightly hyperactive x4 quadrants. Musculoskeletal: No lower extremity tenderness nor edema.  No joint effusions.  Neurologic:  Normal speech and language. No gross focal neurologic deficits are appreciated.  Skin:  Skin is warm, dry and intact. No rash noted. Psychiatric: Mood and affect are normal. Speech and behavior are normal.  ____________________________________________   LABS (all labs ordered are listed, but only abnormal results are displayed)  Labs Reviewed  CBC WITH DIFFERENTIAL/PLATELET - Abnormal; Notable for the following components:      Result Value   WBC 13.5 (*)     Neutro Abs 12.4 (*)    Lymphs Abs 0.4 (*)    All other components within normal limits  COMPREHENSIVE METABOLIC PANEL - Abnormal; Notable for the following components:   Glucose, Bld 106 (*)    Total Protein 8.2 (*)    All other components within normal limits  URINE DRUG SCREEN, QUALITATIVE ([facility] ONLY) - Abnormal; Notable for the following components:   Cannabinoid 50 Ng, Ur [place] POSITIVE (*)    All other components within normal limits      PROCEDURES  Procedure(s) performed (including Critical Care):  Procedures   ____________________________________________   INITIAL IMPRESSION / ASSESSMENT AND PLAN / ED COURSE  As part of my medical decision making, I reviewed the following data within the electronic MEDICAL RECORD NUMBER Notes from prior ED visits and [place] Controlled Substance Database  20 year old male presents to the ED with complaint of onset early this morning of nausea, vomiting and diarrhea.  Patient was also worried because this started after he smoked some marijuana that he thought may be spiked with something else.  UDS was only positive for cannabinoid.  White count was slightly elevated at 13.5 otherwise lab work was unremarkable.  Patient was given Zofran while in the ED and had no continued vomiting or diarrhea.  We discussed staying on clear liquids the remainder of today and advancing his diet slowly to a brat diet.  A prescription for Zofran was sent to the pharmacy for this patient.  He is aware that he can take Tylenol if needed and drink plenty of clear fluids.  ____________________________________________   FINAL CLINICAL IMPRESSION(S) / ED DIAGNOSES  Final diagnoses:  Nausea vomiting and diarrhea     ED Discharge Orders         Ordered    ondansetron (ZOFRAN ODT) 4 MG disintegrating tablet  Every 8 hours PRN        10/04/20 1454          *Please note:  Francisco Miranda was evaluated in Emergency Department on 10/04/2020 for the symptoms described in  the history of present illness. He was evaluated in the context of the global COVID-19 pandemic, which necessitated consideration that the patient might be at risk for infection with the SARS-CoV-2 virus that causes COVID-19. Institutional protocols and algorithms that pertain to the evaluation of patients at risk for COVID-19 are in a state of rapid change based on information released by regulatory bodies including the CDC and federal and state organizations. These policies and algorithms were followed during the patient's care in the ED.  Some ED evaluations and interventions may be delayed as a result of limited staffing during and the pandemic.*   Note:  This document was prepared using Dragon voice recognition software and may include unintentional dictation errors.    Tommi Rumps, PA-C 10/04/20 1515    Sharyn Creamer, MD 10/04/20 Zollie Pee

## 2021-03-14 IMAGING — CT CT HEAD W/O CM
3 series · 15 of 47 positions shown, 18 images · non-contrast
Comparison: None.

CLINICAL DATA: Ingested unknown amount of the Xanax, bradycardia,
altered level of consciousness, head trauma

EXAM:
CT HEAD WITHOUT CONTRAST
CT CERVICAL SPINE WITHOUT CONTRAST
TECHNIQUE: Multidetector CT imaging of the head and cervical spine was
performed following the standard protocol without intravenous
contrast. Multiplanar CT image reconstructions of the cervical spine
were also generated.

[Series 3: head wo · axial · 0.46mm/px · z∈[-147,-22]mm · 9 of 31 slices shown, 12 images]
[im 3/31  brain]
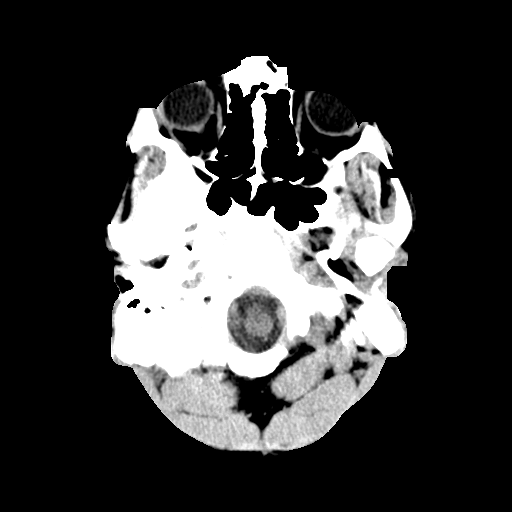
[im 3/31  bone]
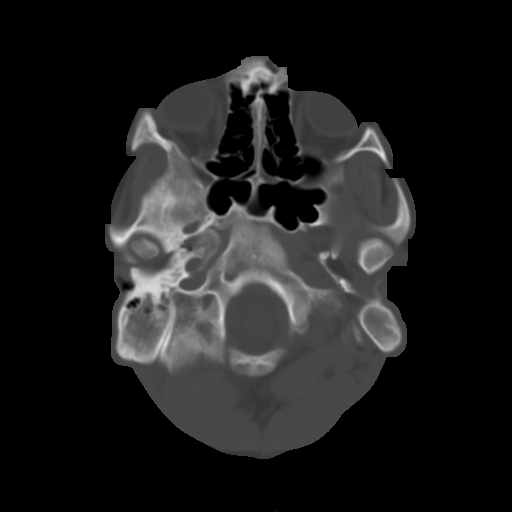
[im 6/31  brain]
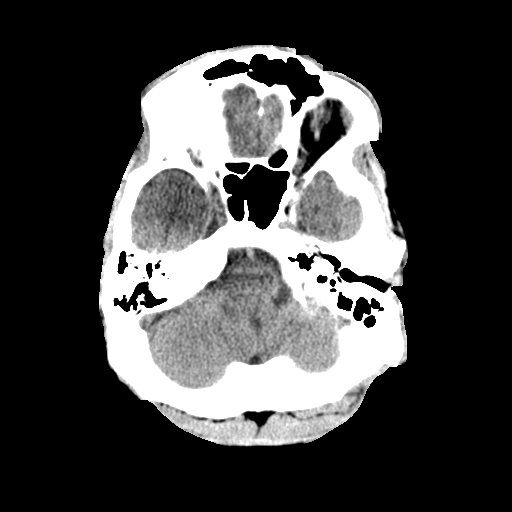
[im 9/31  brain]
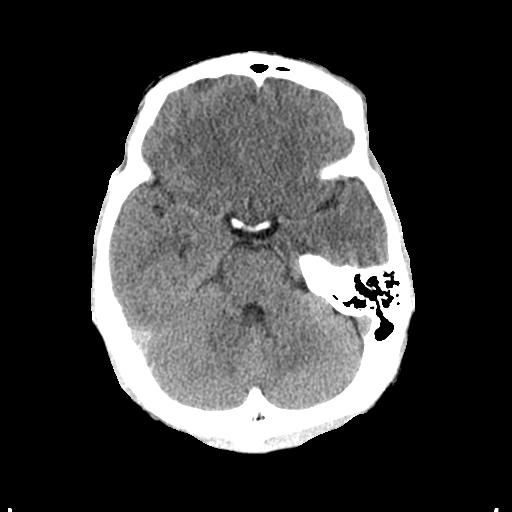
[im 12/31  brain]
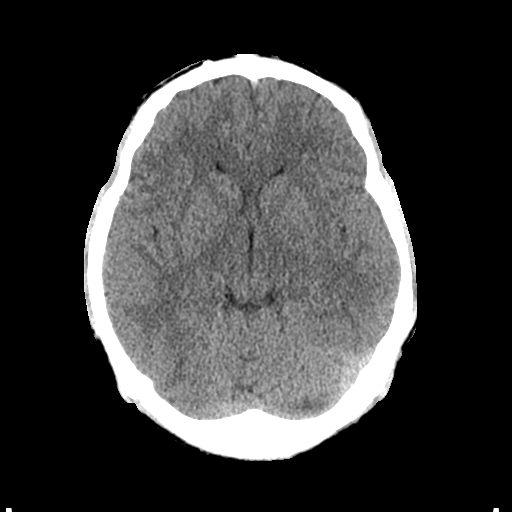
[im 16/31  brain]
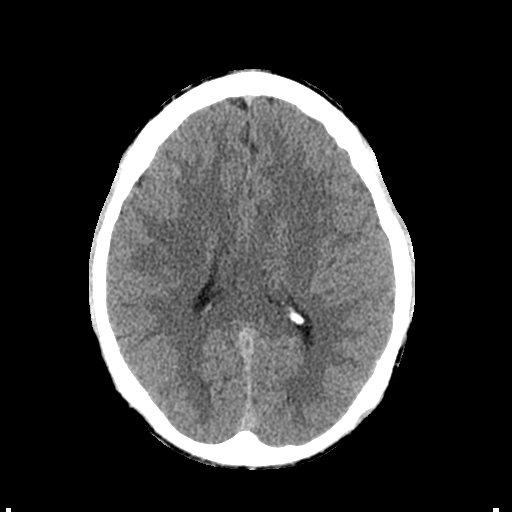
[im 16/31  bone]
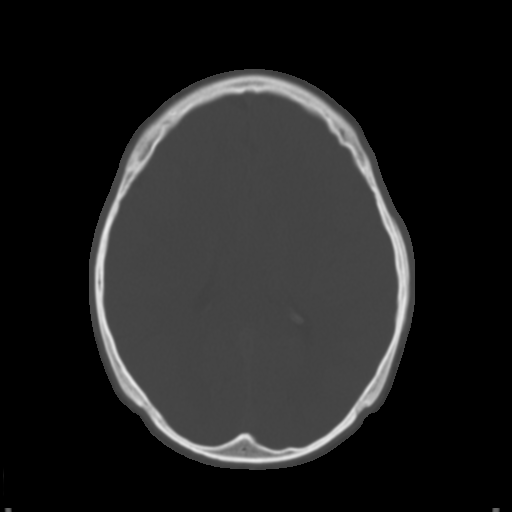
[im 19/31  brain]
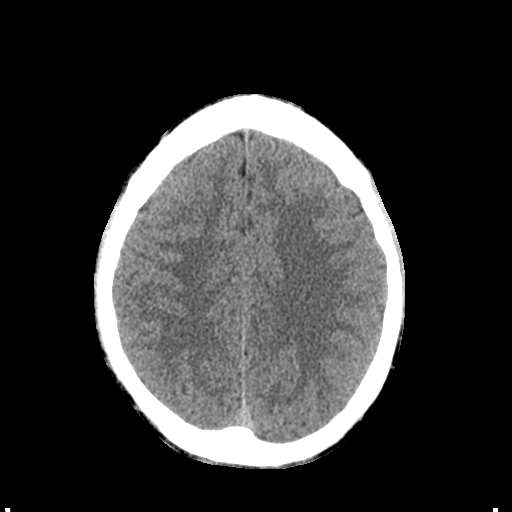
[im 22/31  brain]
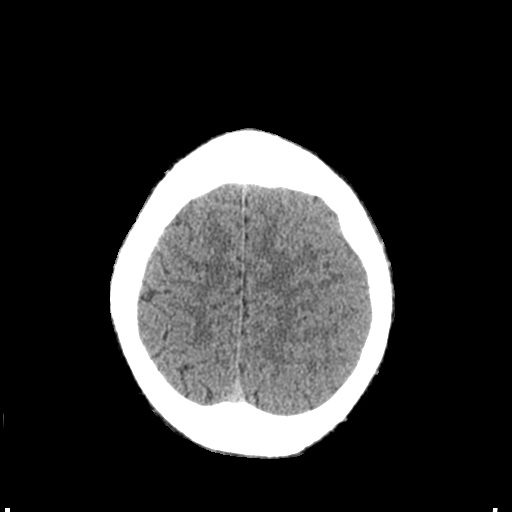
[im 25/31  brain]
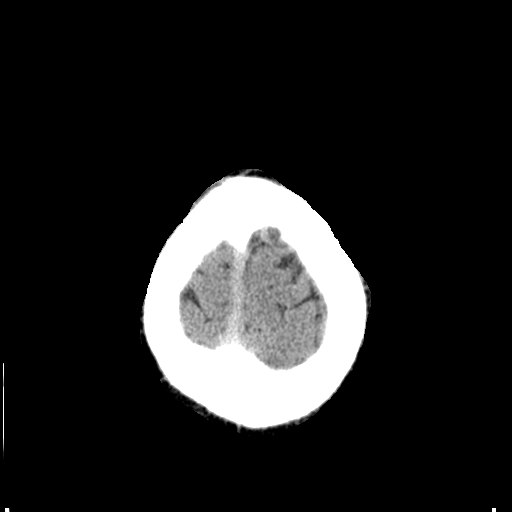
[im 28/31  brain]
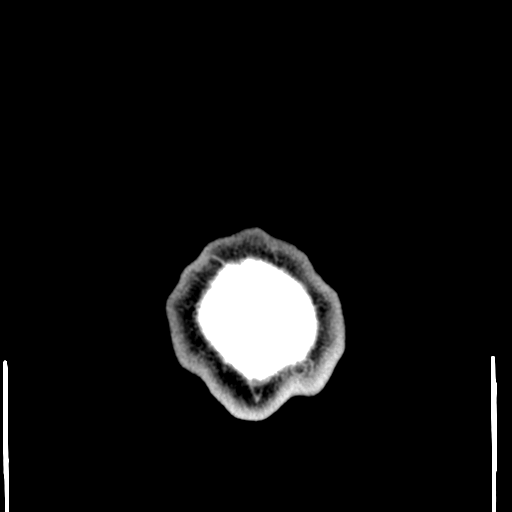
[im 28/31  bone]
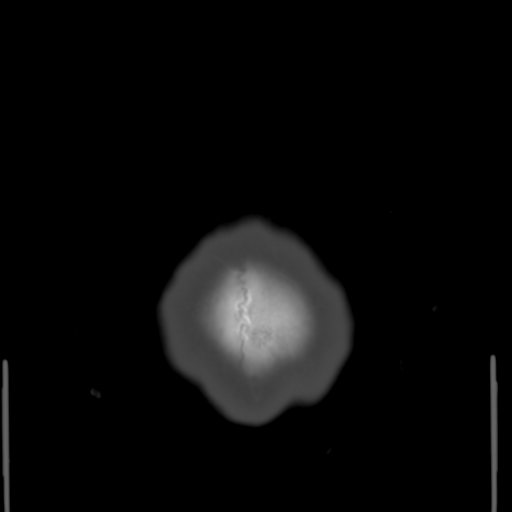

[Series 4: coronal soft tissue · coronal · 0.31mm/px · 3 of 64 slices shown]
[im 22/64  brain]
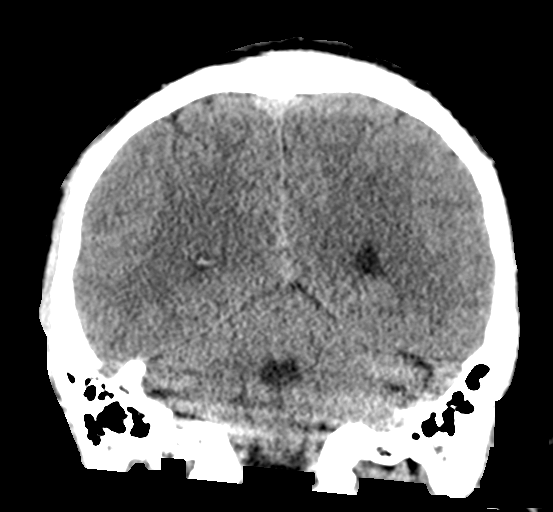
[im 29/64  brain]
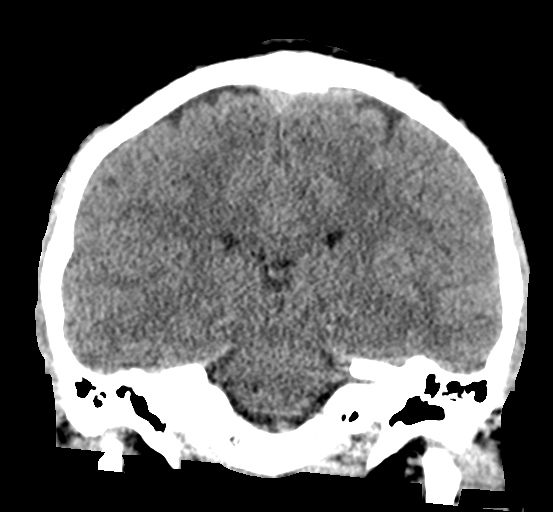
[im 36/64  brain]
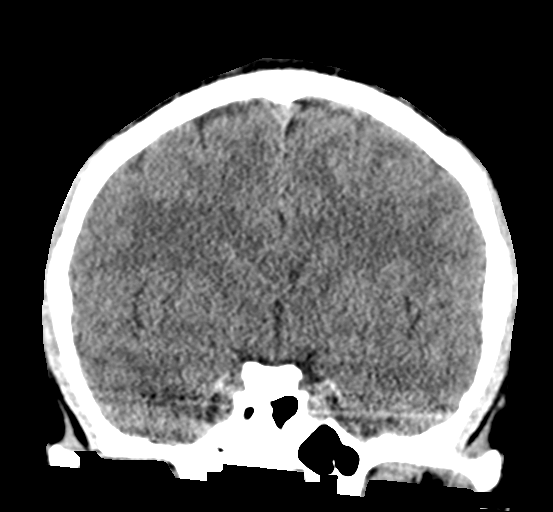

[Series 5: sagittal soft tissue · sagittal · 0.31mm/px · 3 of 55 slices shown]
[im 19/55  brain]
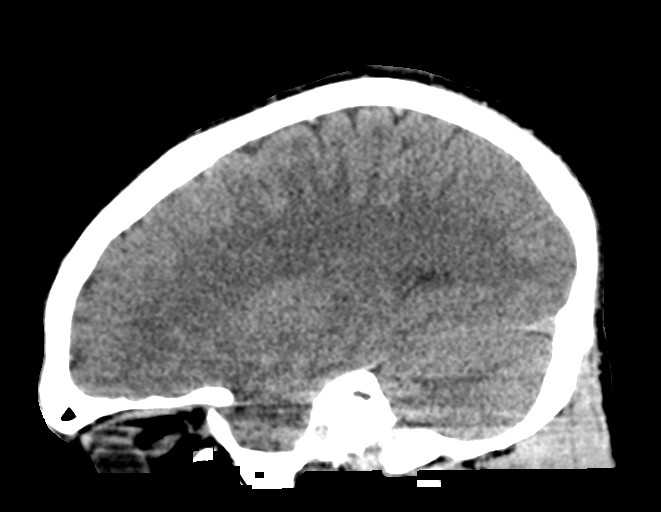
[im 28/55  brain]
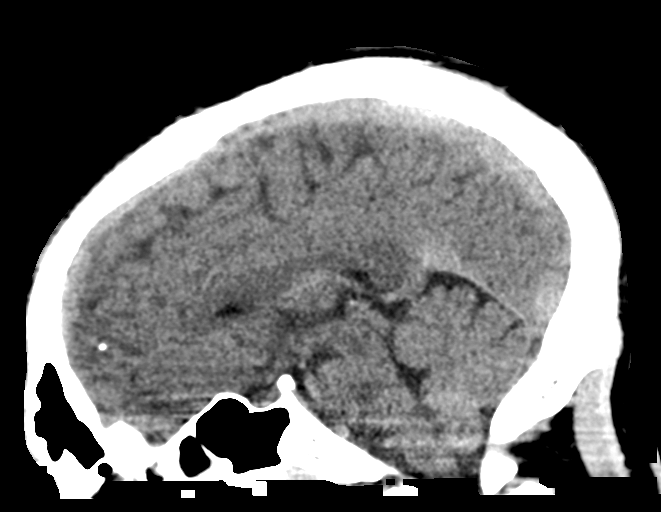
[im 37/55  brain]
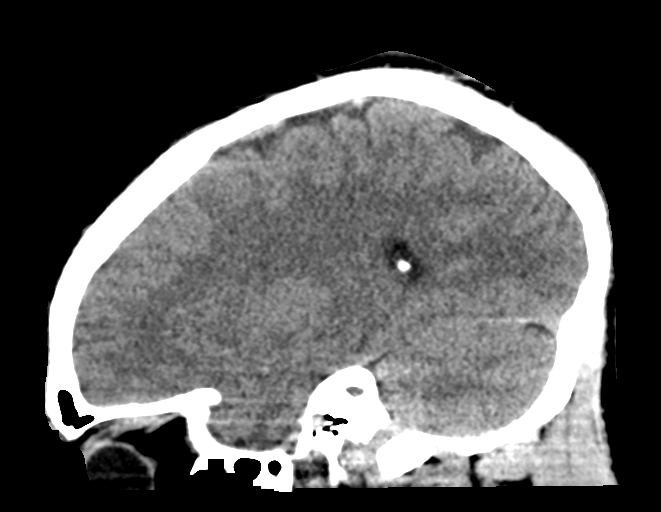

[15 of 47 positions shown; findings below may reference images not displayed]

FINDINGS: CT HEAD FINDINGS

Brain: No acute infarct or hemorrhage. Lateral ventricles and
midline structures are unremarkable. No acute extra-axial fluid
collections. No mass effect.

Vascular: No hyperdense vessel or unexpected calcification.

Skull: Chronic punctate metallic foreign body within the anterior
scalp. Negative for fracture or focal lesion.

Sinuses/Orbits: No acute finding.

Other: Reconstructed images demonstrate no additional findings.

CT CERVICAL SPINE FINDINGS

Alignment: Slight straightening of the cervical spine may be
positional. Otherwise alignment is anatomic.

Skull base and vertebrae: No acute displaced fracture.

Soft tissues and spinal canal: No prevertebral fluid or swelling. No
visible canal hematoma.

Disc levels:  No significant spondylosis or facet hypertrophy.

Upper chest: Airway is patent.  Lung apices are clear.

Other: Reconstructed images demonstrate no additional findings.
IMPRESSION: 1. No acute intracranial process.
2. Unremarkable cervical spine.

## 2021-03-14 IMAGING — CT CT CERVICAL SPINE W/O CM
3 series · 11 of 33 positions shown, 13 images · non-contrast
Comparison: None.

CLINICAL DATA: Ingested unknown amount of the Xanax, bradycardia,
altered level of consciousness, head trauma

EXAM:
CT HEAD WITHOUT CONTRAST
CT CERVICAL SPINE WITHOUT CONTRAST
TECHNIQUE: Multidetector CT imaging of the head and cervical spine was
performed following the standard protocol without intravenous
contrast. Multiplanar CT image reconstructions of the cervical spine
were also generated.

[Series 3: c spine soft · axial · 0.36mm/px · z∈[-271,-177]mm · 3 of 77 slices shown, 4 images]
[im 18/77  soft-tissue]
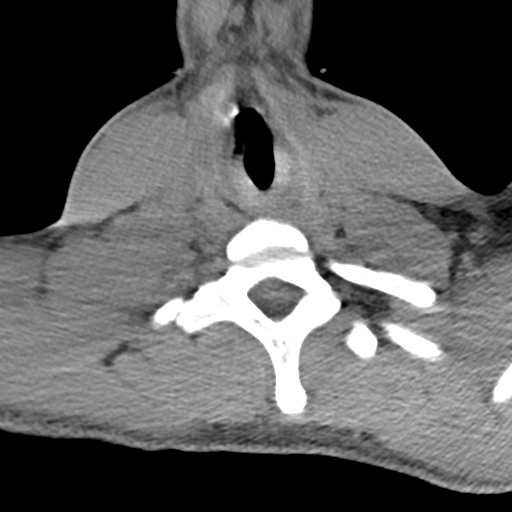
[im 18/77  bone]
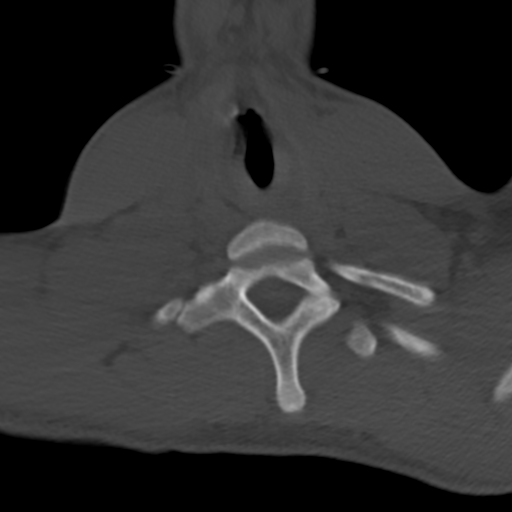
[im 41/77  bone]
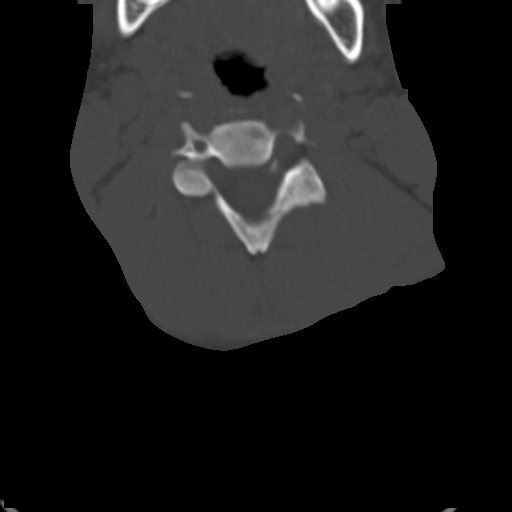
[im 65/77  bone]
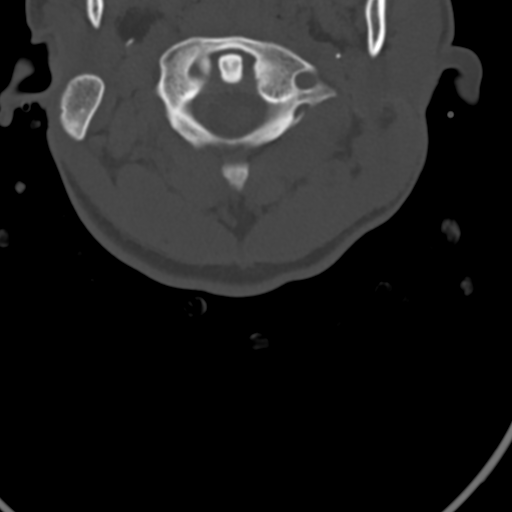

[Series 6: sagittal bone · sagittal · 0.29mm/px · 5 of 67 slices shown, 6 images]
[im 23/67  bone]
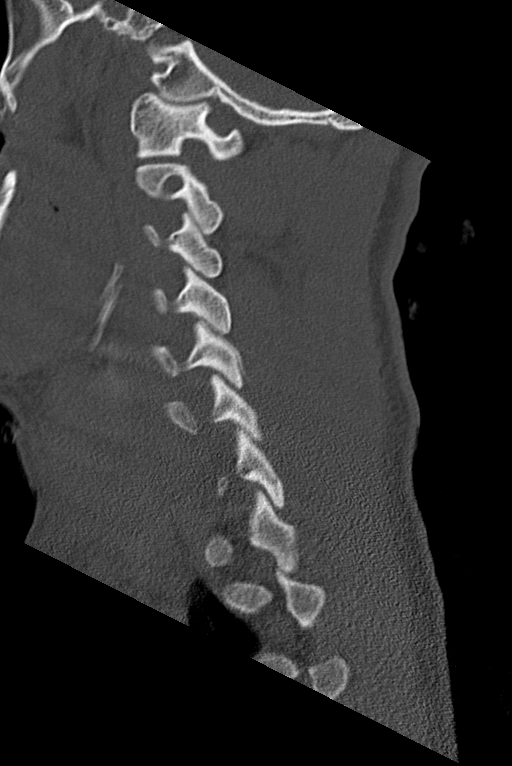
[im 28/67  bone]
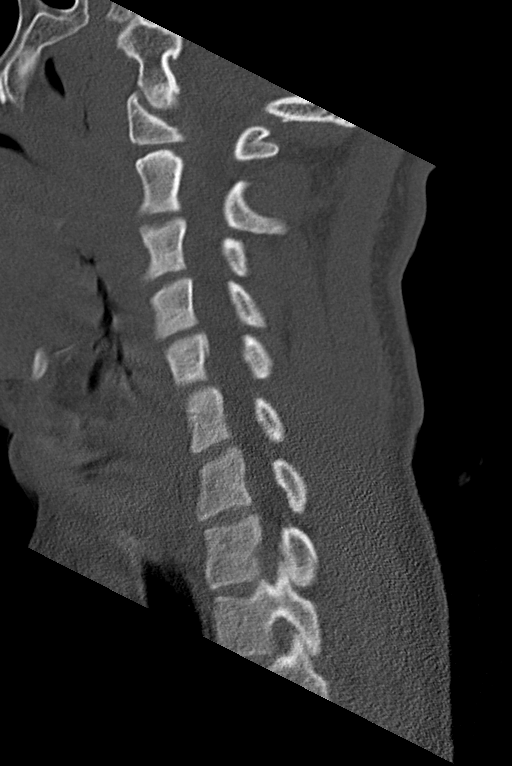
[im 34/67  soft-tissue]
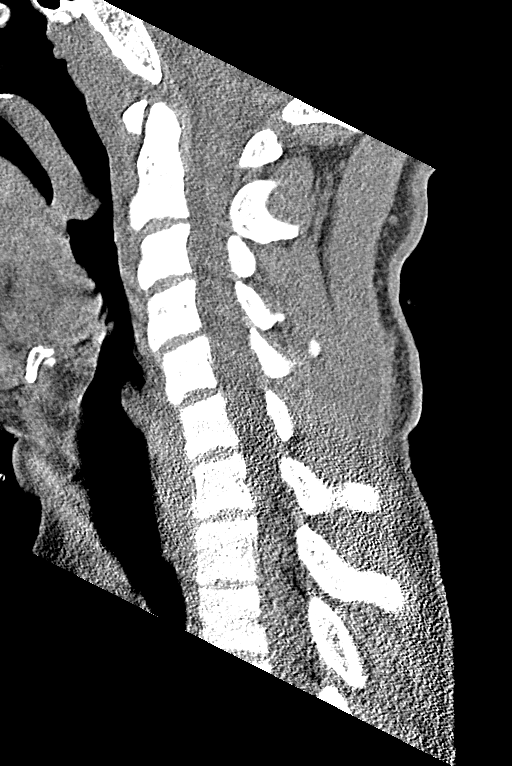
[im 34/67  bone]
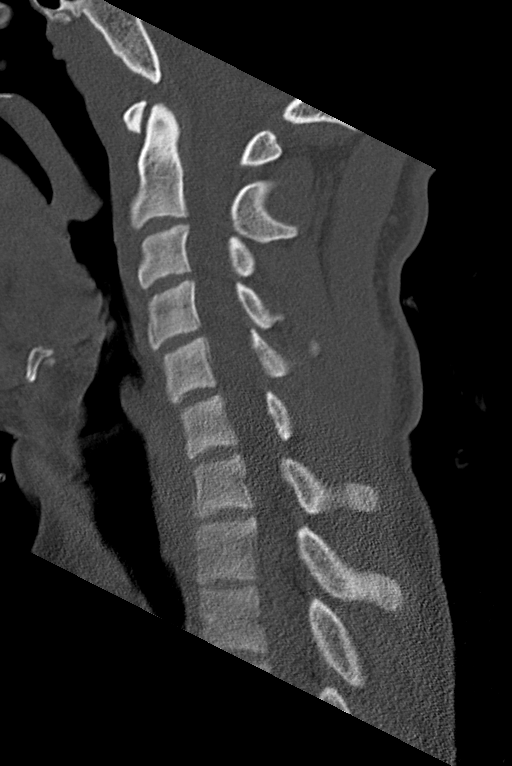
[im 39/67  bone]
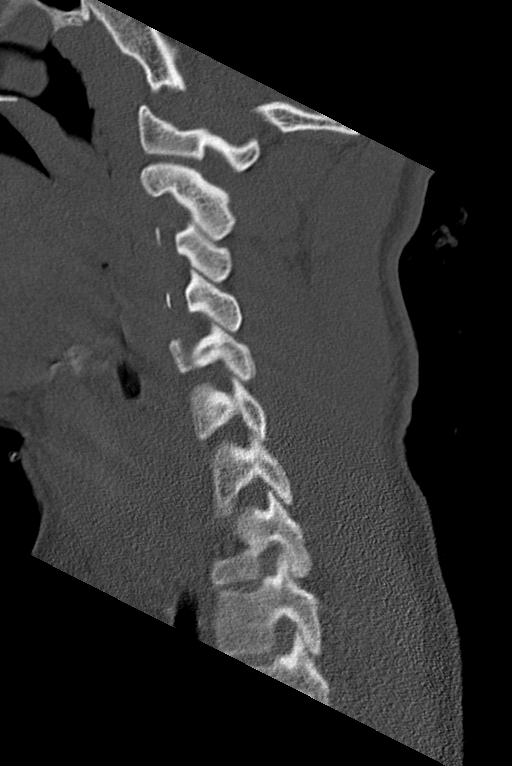
[im 45/67  bone]
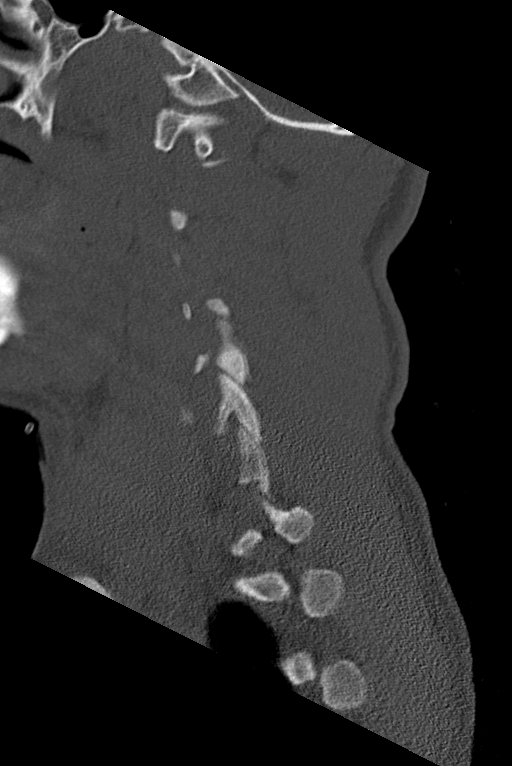

[Series 7: coronal bone · coronal · 0.22mm/px · 3 of 63 slices shown]
[im 20/63  bone]
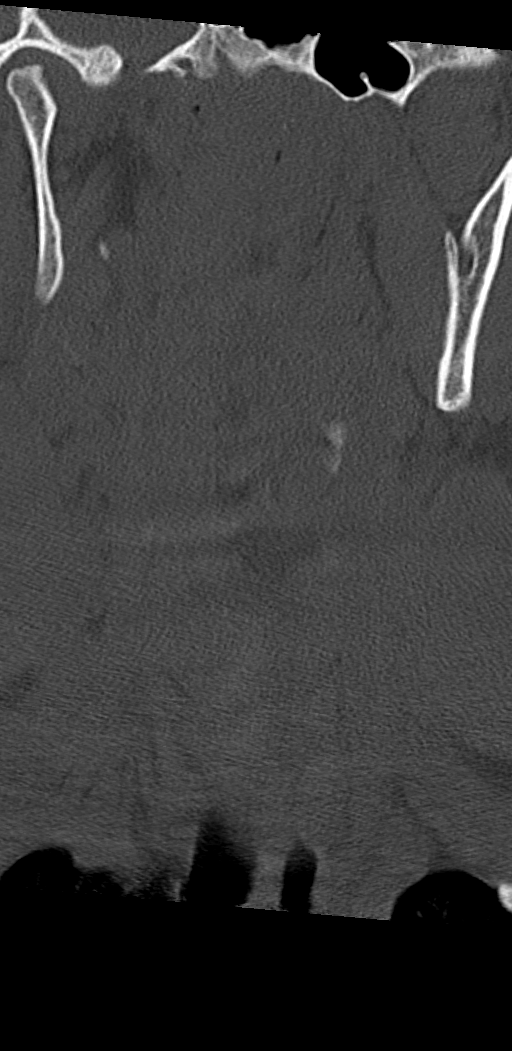
[im 28/63  bone]
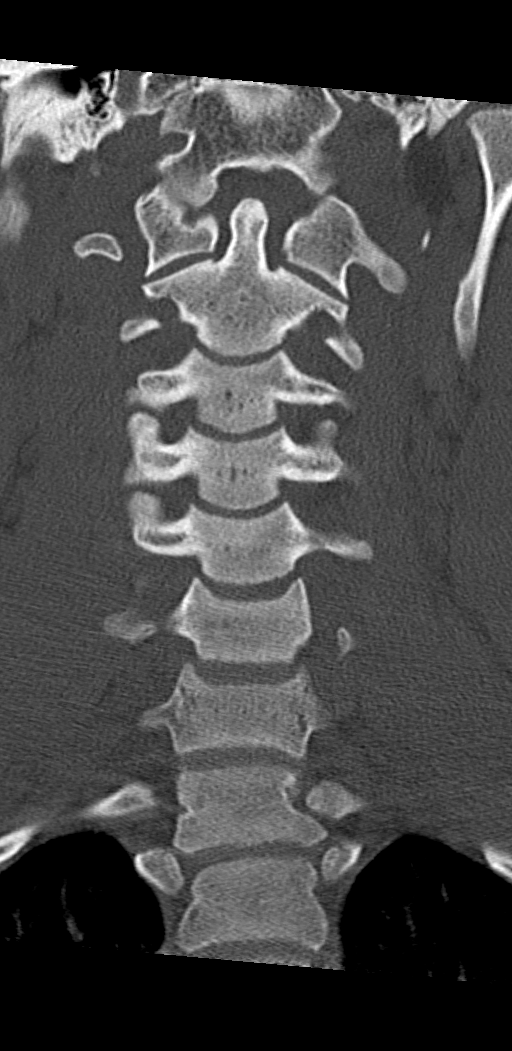
[im 36/63  bone]
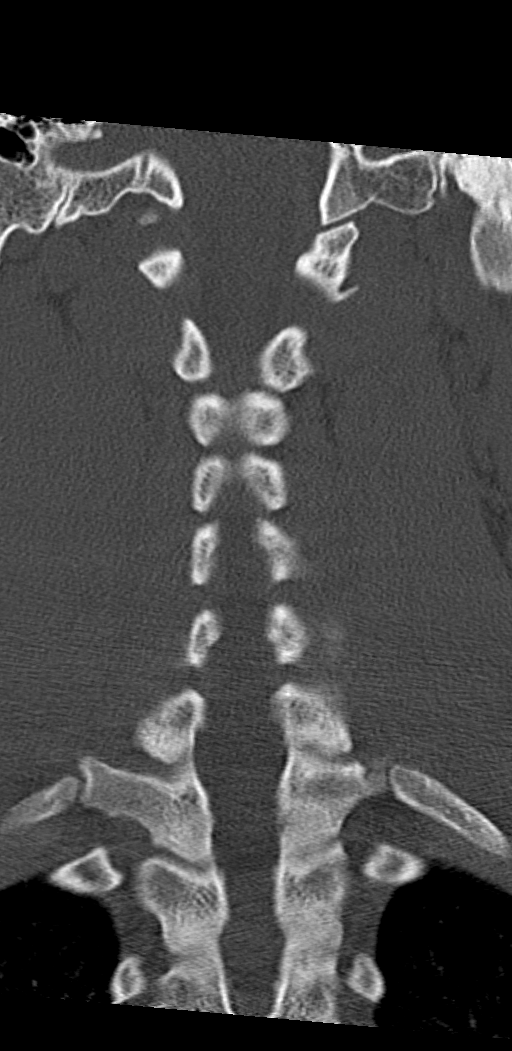

[11 of 33 positions shown; findings below may reference images not displayed]

FINDINGS: CT HEAD FINDINGS

Brain: No acute infarct or hemorrhage. Lateral ventricles and
midline structures are unremarkable. No acute extra-axial fluid
collections. No mass effect.

Vascular: No hyperdense vessel or unexpected calcification.

Skull: Chronic punctate metallic foreign body within the anterior
scalp. Negative for fracture or focal lesion.

Sinuses/Orbits: No acute finding.

Other: Reconstructed images demonstrate no additional findings.

CT CERVICAL SPINE FINDINGS

Alignment: Slight straightening of the cervical spine may be
positional. Otherwise alignment is anatomic.

Skull base and vertebrae: No acute displaced fracture.

Soft tissues and spinal canal: No prevertebral fluid or swelling. No
visible canal hematoma.

Disc levels:  No significant spondylosis or facet hypertrophy.

Upper chest: Airway is patent.  Lung apices are clear.

Other: Reconstructed images demonstrate no additional findings.
IMPRESSION: 1. No acute intracranial process.
2. Unremarkable cervical spine.

## 2022-02-04 ENCOUNTER — Encounter: Payer: Self-pay | Admitting: Emergency Medicine

## 2022-02-04 ENCOUNTER — Other Ambulatory Visit: Payer: Self-pay

## 2022-02-04 ENCOUNTER — Emergency Department
Admission: EM | Admit: 2022-02-04 | Discharge: 2022-02-04 | Disposition: A | Discharge status: Home / Self Care | Payer: Medicaid Other | Attending: Emergency Medicine | Admitting: Emergency Medicine

## 2022-02-04 DIAGNOSIS — R21 Rash and other nonspecific skin eruption: Secondary | ICD-10-CM | POA: Diagnosis present

## 2022-02-04 DIAGNOSIS — L2389 Allergic contact dermatitis due to other agents: Secondary | ICD-10-CM | POA: Diagnosis not present

## 2022-02-04 DIAGNOSIS — L309 Dermatitis, unspecified: Secondary | ICD-10-CM

## 2022-02-04 MED ORDER — PREDNISONE 20 MG PO TABS
60.0000 mg | ORAL_TABLET | Freq: Once | ORAL | Status: AC
  Administered 2022-02-04: 60 mg via ORAL
  Filled 2022-02-04: qty 3

## 2022-02-04 MED ORDER — PREDNISONE 50 MG PO TABS: 50.0000 mg | ORAL_TABLET | Freq: Every day | ORAL | 0 refills | Status: AC

## 2022-02-04 MED ORDER — PREDNISONE 50 MG PO TABS: 50.0000 mg | ORAL_TABLET | Freq: Every day | ORAL | 0 refills | Status: DC

## 2022-02-04 MED ORDER — DIPHENHYDRAMINE HCL 25 MG PO CAPS
50.0000 mg | ORAL_CAPSULE | Freq: Once | ORAL | Status: AC
  Administered 2022-02-04: 50 mg via ORAL
  Filled 2022-02-04: qty 2

## 2022-02-04 NOTE — ED Provider Notes (Signed)
[facility] Provider Note  Patient Contact: 5:01 PM (approximate)   History   Rash   HPI  Francisco Miranda is a 21 y.o. male who presents the emergency department complaining of diffuse rash and itch.  Patient states that he used a new soap, started breaking out with a pruritic fine papular like rash.  Patient states that he has no other history of allergies.  Patient states that the symptoms developed immediately after using the soap.  He denies any GI complaints, respiratory complaints.  He has not tried any medications prior to arrival.     Physical Exam   Triage Vital Signs: ED Triage Vitals  Enc Vitals Group     BP 02/04/22 1430 129/64     Pulse Rate 02/04/22 1430 77     Resp 02/04/22 1430 20     Temp 02/04/22 1430 98.3 F (36.8 C)     Temp Source 02/04/22 1430 Oral     SpO2 02/04/22 1430 98 %     Weight 02/04/22 1413 160 lb 15 oz (73 kg)     Height 02/04/22 1413 5\' 11"  (1.803 m)     Head Circumference --      Peak Flow --      Pain Score 02/04/22 1413 9     Pain Loc --      Pain Edu? --      Excl. in GC? --     Most recent vital signs: Vitals:   02/04/22 1430  BP: 129/64  Pulse: 77  Resp: 20  Temp: 98.3 F (36.8 C)  SpO2: 98%     General: Alert and in no acute distress.  Cardiovascular:  Good peripheral perfusion Respiratory: Normal respiratory effort without tachypnea or retractions. Lungs CTAB. Good air entry to the bases with no decreased or absent breath sounds. Gastrointestinal: Bowel sounds 4 quadrants. Soft and nontender to palpation. No guarding or rigidity. No palpable masses. No distention.  Musculoskeletal: Full range of motion to all extremities.  Neurologic:  No gross focal neurologic deficits are appreciated.  Skin:   Fine papular rash noted consistent with dermatitis.  Given the recent new chemical/soap content likely allergic.  No evidence of cellulitis.  Patient has an area to the left ankle that is chronically  irritated that appears to be eczema. Other:   ED Results / Procedures / Treatments   Labs (all labs ordered are listed, but only abnormal results are displayed) Labs Reviewed - No data to display   EKG     RADIOLOGY    No results found.  PROCEDURES:  Critical Care performed: No  Procedures   MEDICATIONS ORDERED IN ED: Medications  diphenhydrAMINE (BENADRYL) capsule 50 mg (has no administration in time range)  predniSONE (DELTASONE) tablet 60 mg (has no administration in time range)     IMPRESSION / MDM / ASSESSMENT AND PLAN / ED COURSE  I reviewed the triage vital signs and the nursing notes.                              Differential diagnosis includes, but is not limited to, allergic dermatitis, contact dermatitis, folliculitis, eczema, psoriasis  Patient's presentation is most consistent with acute presentation with potential threat to life or bodily function.   Patient's diagnosis is consistent with allergic dermatitis, eczema.  Patient presented to the ED with 2 skin complaints.  His primary complaint occurred after using a new soap.  Patient broke out in a papular style rash to the majority of his body.  There was no concerning signs or findings concerning for anaphylaxis.  Patient will be treated with Benadryl, steroid for allergic dermatitis.  Patient has an area to the left ankle that is different and is more chronic, this appears to be eczema.  Patient will have oral medication for allergic dermatitis/reaction and I encouraged the patient to use a good moisturizing soap on his eczema.  Follow-up with primary care as needed.  Concerning signs and symptoms and return precautions are discussed with the patient..  Patient is given ED precautions to return to the ED for any worsening or new symptoms.        FINAL CLINICAL IMPRESSION(S) / ED DIAGNOSES   Final diagnoses:  Allergic contact dermatitis due to other agents  Eczema, unspecified type     Rx /  DC Orders   ED Discharge Orders          Ordered    predniSONE (DELTASONE) 50 MG tablet  Daily with breakfast        02/04/22 1728             Note:  This document was prepared using Dragon voice recognition software and may include unintentional dictation errors.   Racheal Patches, PA-C 02/04/22 1728    Pilar Jarvis, MD 02/04/22 [phone]

## 2022-02-04 NOTE — ED Triage Notes (Signed)
Pt reports used some new soap on his body and now has a very itchy rash all over his body.

## 2022-03-09 ENCOUNTER — Ambulatory Visit: Payer: Medicaid Other
